# Patient Record
Sex: Male | Born: 1975 | Race: White | Hispanic: No | Marital: Married | Smoking: Current every day smoker
Health system: Southern US, Community
[De-identification: ages and names within clinical notes are randomized; demographics above are authoritative.]

## PROBLEM LIST (undated history)

## (undated) DIAGNOSIS — K219 Gastro-esophageal reflux disease without esophagitis: Secondary | ICD-10-CM

## (undated) HISTORY — PX: ESOPHAGOGASTRODUODENOSCOPY: SHX1529

---

## 2015-02-06 ENCOUNTER — Other Ambulatory Visit (HOSPITAL_COMMUNITY): Payer: Self-pay | Admitting: Orthopaedic Surgery

## 2015-02-06 ENCOUNTER — Observation Stay (HOSPITAL_COMMUNITY)
Admission: AD | Admit: 2015-02-06 | Discharge: 2015-02-07 | Disposition: A | Discharge status: Home / Self Care | Payer: BLUE CROSS/BLUE SHIELD | Source: Ambulatory Visit | Attending: Orthopaedic Surgery | Admitting: Orthopaedic Surgery

## 2015-02-06 ENCOUNTER — Encounter (HOSPITAL_COMMUNITY): Payer: Self-pay | Admitting: *Deleted

## 2015-02-06 ENCOUNTER — Ambulatory Visit (HOSPITAL_COMMUNITY): Payer: BLUE CROSS/BLUE SHIELD | Admitting: Certified Registered"

## 2015-02-06 ENCOUNTER — Ambulatory Visit (HOSPITAL_COMMUNITY): Payer: BLUE CROSS/BLUE SHIELD

## 2015-02-06 ENCOUNTER — Encounter (HOSPITAL_COMMUNITY)
Admission: AD | Disposition: A | Discharge status: Home / Self Care | Payer: Self-pay | Source: Ambulatory Visit | Attending: Orthopaedic Surgery

## 2015-02-06 DIAGNOSIS — S42002A Fracture of unspecified part of left clavicle, initial encounter for closed fracture: Secondary | ICD-10-CM | POA: Diagnosis present

## 2015-02-06 DIAGNOSIS — Y9289 Other specified places as the place of occurrence of the external cause: Secondary | ICD-10-CM | POA: Insufficient documentation

## 2015-02-06 DIAGNOSIS — Z419 Encounter for procedure for purposes other than remedying health state, unspecified: Secondary | ICD-10-CM

## 2015-02-06 DIAGNOSIS — K219 Gastro-esophageal reflux disease without esophagitis: Secondary | ICD-10-CM | POA: Insufficient documentation

## 2015-02-06 DIAGNOSIS — Z886 Allergy status to analgesic agent status: Secondary | ICD-10-CM | POA: Diagnosis not present

## 2015-02-06 DIAGNOSIS — F17210 Nicotine dependence, cigarettes, uncomplicated: Secondary | ICD-10-CM | POA: Diagnosis not present

## 2015-02-06 DIAGNOSIS — S42022A Displaced fracture of shaft of left clavicle, initial encounter for closed fracture: Secondary | ICD-10-CM | POA: Diagnosis not present

## 2015-02-06 DIAGNOSIS — S42009A Fracture of unspecified part of unspecified clavicle, initial encounter for closed fracture: Secondary | ICD-10-CM | POA: Diagnosis present

## 2015-02-06 HISTORY — PX: ORIF CLAVICULAR FRACTURE: SHX5055

## 2015-02-06 HISTORY — DX: Gastro-esophageal reflux disease without esophagitis: K21.9

## 2015-02-06 LAB — COMPREHENSIVE METABOLIC PANEL
ALT: 14 U/L — AB (ref 17–63)
ANION GAP: 9 (ref 5–15)
AST: 22 U/L (ref 15–41)
Albumin: 4.1 g/dL (ref 3.5–5.0)
Alkaline Phosphatase: 55 U/L (ref 38–126)
BUN: 7 mg/dL (ref 6–20)
CO2: 25 mmol/L (ref 22–32)
CREATININE: 1.02 mg/dL (ref 0.61–1.24)
Calcium: 9.4 mg/dL (ref 8.9–10.3)
Chloride: 106 mmol/L (ref 101–111)
GFR calc Af Amer: >60 mL/min (ref >60)
Glucose, Bld: 82 mg/dL (ref 65–99)
Potassium: 4.6 mmol/L (ref 3.5–5.1)
Sodium: 140 mmol/L (ref 135–145)
Total Bilirubin: 0.8 mg/dL (ref 0.3–1.2)
Total Protein: 6.8 g/dL (ref 6.5–8.1)

## 2015-02-06 LAB — CBC
HEMATOCRIT: 43.4 % (ref 39.0–52.0)
HEMOGLOBIN: 15.6 g/dL (ref 13.0–17.0)
MCH: 32.2 pg (ref 26.0–34.0)
MCHC: 35.9 g/dL (ref 30.0–36.0)
MCV: 89.5 fL (ref 78.0–100.0)
PLATELETS: 195 10*3/uL (ref 150–400)
RBC: 4.85 MIL/uL (ref 4.22–5.81)
RDW: 12.1 % (ref 11.5–15.5)
WBC: 5.5 10*3/uL (ref 4.0–10.5)

## 2015-02-06 SURGERY — OPEN REDUCTION INTERNAL FIXATION (ORIF) CLAVICULAR FRACTURE
Anesthesia: General | Site: Chest | Laterality: Left

## 2015-02-06 MED ORDER — PROMETHAZINE HCL 25 MG/ML IJ SOLN
6.2500 mg | INTRAMUSCULAR | Status: DC | PRN
  Administered 2015-02-06: 6.25 mg via INTRAVENOUS

## 2015-02-06 MED ORDER — GLYCOPYRROLATE 0.2 MG/ML IJ SOLN
INTRAMUSCULAR | Status: AC
  Filled 2015-02-06: qty 1

## 2015-02-06 MED ORDER — DIAZEPAM 5 MG PO TABS
ORAL_TABLET | ORAL | Status: AC
  Filled 2015-02-06: qty 1

## 2015-02-06 MED ORDER — GLYCOPYRROLATE 0.2 MG/ML IJ SOLN
INTRAMUSCULAR | Status: DC | PRN
Start: 2015-02-06 — End: 2015-02-06
  Administered 2015-02-06: 0.2 mg via INTRAVENOUS
  Administered 2015-02-06: 0.6 mg via INTRAVENOUS

## 2015-02-06 MED ORDER — PROPOFOL 10 MG/ML IV BOLUS
INTRAVENOUS | Status: AC
  Filled 2015-02-06: qty 20

## 2015-02-06 MED ORDER — PROMETHAZINE HCL 25 MG/ML IJ SOLN
INTRAMUSCULAR | Status: AC
Start: 2015-02-06 — End: 2015-02-07
  Filled 2015-02-06: qty 1

## 2015-02-06 MED ORDER — KETOROLAC TROMETHAMINE 30 MG/ML IJ SOLN
INTRAMUSCULAR | Status: DC | PRN
  Administered 2015-02-06: 30 mg via INTRAVENOUS

## 2015-02-06 MED ORDER — MIDAZOLAM HCL 2 MG/2ML IJ SOLN: 0.5000 mg | Freq: Once | INTRAMUSCULAR | Status: DC | PRN

## 2015-02-06 MED ORDER — ONDANSETRON HCL 4 MG PO TABS
4.0000 mg | ORAL_TABLET | Freq: Four times a day (QID) | ORAL | Status: DC | PRN
  Administered 2015-02-07: 4 mg via ORAL
  Filled 2015-02-06: qty 1

## 2015-02-06 MED ORDER — BUPIVACAINE-EPINEPHRINE (PF) 0.25% -1:200000 IJ SOLN
INTRAMUSCULAR | Status: AC
  Filled 2015-02-06: qty 30

## 2015-02-06 MED ORDER — OXYCODONE-ACETAMINOPHEN 5-325 MG PO TABS
2.0000 | ORAL_TABLET | ORAL | Status: DC | PRN
  Administered 2015-02-07 (×3): 2 via ORAL
  Filled 2015-02-06 (×3): qty 2

## 2015-02-06 MED ORDER — KETOROLAC TROMETHAMINE 30 MG/ML IJ SOLN
INTRAMUSCULAR | Status: AC
  Filled 2015-02-06: qty 1

## 2015-02-06 MED ORDER — METOCLOPRAMIDE HCL 5 MG/ML IJ SOLN: 5.0000 mg | Freq: Three times a day (TID) | INTRAMUSCULAR | Status: DC | PRN

## 2015-02-06 MED ORDER — CEFAZOLIN SODIUM-DEXTROSE 2-3 GM-% IV SOLR
2.0000 g | INTRAVENOUS | Status: AC
  Administered 2015-02-06: 2 g via INTRAVENOUS
  Filled 2015-02-06: qty 50

## 2015-02-06 MED ORDER — LACTATED RINGERS IV SOLN
INTRAVENOUS | Status: DC
  Administered 2015-02-06 (×2): via INTRAVENOUS

## 2015-02-06 MED ORDER — PROPOFOL 10 MG/ML IV BOLUS
INTRAVENOUS | Status: DC | PRN
  Administered 2015-02-06: 200 mg via INTRAVENOUS

## 2015-02-06 MED ORDER — LIDOCAINE HCL (CARDIAC) 20 MG/ML IV SOLN
INTRAVENOUS | Status: DC | PRN
  Administered 2015-02-06: 30 mg via INTRAVENOUS

## 2015-02-06 MED ORDER — TRAMADOL HCL 50 MG PO TABS
50.0000 mg | ORAL_TABLET | Freq: Four times a day (QID) | ORAL | Status: DC | PRN
  Administered 2015-02-06: 50 mg via ORAL
  Filled 2015-02-06: qty 1

## 2015-02-06 MED ORDER — OXYCODONE-ACETAMINOPHEN 5-325 MG PO TABS
1.0000 | ORAL_TABLET | Freq: Once | ORAL | Status: AC
  Administered 2015-02-06: 1 via ORAL

## 2015-02-06 MED ORDER — FENTANYL CITRATE (PF) 250 MCG/5ML IJ SOLN
INTRAMUSCULAR | Status: AC
  Filled 2015-02-06: qty 5

## 2015-02-06 MED ORDER — HYDROMORPHONE HCL 1 MG/ML IJ SOLN
0.2500 mg | INTRAMUSCULAR | Status: DC | PRN
  Administered 2015-02-06 (×3): 0.5 mg via INTRAVENOUS

## 2015-02-06 MED ORDER — BUPIVACAINE-EPINEPHRINE (PF) 0.5% -1:200000 IJ SOLN
INTRAMUSCULAR | Status: DC | PRN
  Administered 2015-02-06: 20 mL

## 2015-02-06 MED ORDER — DIAZEPAM 5 MG PO TABS
5.0000 mg | ORAL_TABLET | Freq: Two times a day (BID) | ORAL | Status: DC | PRN
  Administered 2015-02-06 – 2015-02-07 (×2): 5 mg via ORAL
  Filled 2015-02-06: qty 1

## 2015-02-06 MED ORDER — ONDANSETRON HCL 4 MG/2ML IJ SOLN
INTRAMUSCULAR | Status: DC | PRN
  Administered 2015-02-06: 4 mg via INTRAVENOUS

## 2015-02-06 MED ORDER — HYDROMORPHONE HCL 1 MG/ML IJ SOLN: 0.5000 mg | INTRAMUSCULAR | Status: DC | PRN

## 2015-02-06 MED ORDER — ROCURONIUM BROMIDE 50 MG/5ML IV SOLN
INTRAVENOUS | Status: AC
  Filled 2015-02-06: qty 1

## 2015-02-06 MED ORDER — MIDAZOLAM HCL 5 MG/5ML IJ SOLN
INTRAMUSCULAR | Status: DC | PRN
  Administered 2015-02-06: 2 mg via INTRAVENOUS

## 2015-02-06 MED ORDER — METOCLOPRAMIDE HCL 5 MG PO TABS
5.0000 mg | ORAL_TABLET | Freq: Three times a day (TID) | ORAL | Status: DC | PRN
Start: 2015-02-06 — End: 2015-02-07

## 2015-02-06 MED ORDER — ONDANSETRON HCL 4 MG/2ML IJ SOLN: 4.0000 mg | Freq: Four times a day (QID) | INTRAMUSCULAR | Status: DC | PRN

## 2015-02-06 MED ORDER — OXYCODONE-ACETAMINOPHEN 5-325 MG PO TABS
ORAL_TABLET | ORAL | Status: AC
  Filled 2015-02-06: qty 1

## 2015-02-06 MED ORDER — BUPIVACAINE-EPINEPHRINE (PF) 0.5% -1:200000 IJ SOLN
INTRAMUSCULAR | Status: AC
  Filled 2015-02-06: qty 30

## 2015-02-06 MED ORDER — POTASSIUM CHLORIDE IN NACL 20-0.45 MEQ/L-% IV SOLN
INTRAVENOUS | Status: DC
  Filled 2015-02-06 (×3): qty 1000

## 2015-02-06 MED ORDER — FENTANYL CITRATE (PF) 100 MCG/2ML IJ SOLN
INTRAMUSCULAR | Status: DC | PRN
  Administered 2015-02-06 (×3): 50 ug via INTRAVENOUS
  Administered 2015-02-06: 100 ug via INTRAVENOUS

## 2015-02-06 MED ORDER — MEPERIDINE HCL 25 MG/ML IJ SOLN: 6.2500 mg | INTRAMUSCULAR | Status: DC | PRN

## 2015-02-06 MED ORDER — NEOSTIGMINE METHYLSULFATE 10 MG/10ML IV SOLN
INTRAVENOUS | Status: AC
  Filled 2015-02-06: qty 1

## 2015-02-06 MED ORDER — MIDAZOLAM HCL 2 MG/2ML IJ SOLN
INTRAMUSCULAR | Status: AC
  Filled 2015-02-06: qty 2

## 2015-02-06 MED ORDER — NEOSTIGMINE METHYLSULFATE 10 MG/10ML IV SOLN
INTRAVENOUS | Status: DC | PRN
  Administered 2015-02-06: 4 mg via INTRAVENOUS

## 2015-02-06 MED ORDER — HYDROMORPHONE HCL 1 MG/ML IJ SOLN
INTRAMUSCULAR | Status: AC
  Filled 2015-02-06: qty 2

## 2015-02-06 MED ORDER — ROCURONIUM BROMIDE 100 MG/10ML IV SOLN
INTRAVENOUS | Status: DC | PRN
  Administered 2015-02-06: 10 mg via INTRAVENOUS
  Administered 2015-02-06: 40 mg via INTRAVENOUS

## 2015-02-06 SURGICAL SUPPLY — 54 items
BIT DRILL 110X2.5XQCK CNCT (BIT) ×1 IMPLANT
BIT DRILL 2.5 (BIT) ×2
BIT DRL 110X2.5XQCK CNCT (BIT) ×1
CLOSURE STERI-STRIP 1/2X4 (GAUZE/BANDAGES/DRESSINGS) ×1
CLOSURE WOUND 1/2 X4 (GAUZE/BANDAGES/DRESSINGS) ×1
CLSR STERI-STRIP ANTIMIC 1/2X4 (GAUZE/BANDAGES/DRESSINGS) ×2 IMPLANT
COVER SURGICAL LIGHT HANDLE (MISCELLANEOUS) ×3 IMPLANT
DRAPE C-ARM 42X72 X-RAY (DRAPES) ×3 IMPLANT
DRAPE IMP U-DRAPE 54X76 (DRAPES) ×3 IMPLANT
DRAPE INCISE IOBAN 66X45 STRL (DRAPES) IMPLANT
DRAPE U-SHAPE 47X51 STRL (DRAPES) ×3 IMPLANT
DRSG EMULSION OIL 3X3 NADH (GAUZE/BANDAGES/DRESSINGS) ×3 IMPLANT
ELECT REM PT RETURN 9FT ADLT (ELECTROSURGICAL) ×3
ELECTRODE REM PT RTRN 9FT ADLT (ELECTROSURGICAL) ×1 IMPLANT
GAUZE SPONGE 4X4 12PLY STRL (GAUZE/BANDAGES/DRESSINGS) ×3 IMPLANT
GLOVE BIOGEL PI IND STRL 8 (GLOVE) ×2 IMPLANT
GLOVE BIOGEL PI INDICATOR 8 (GLOVE) ×4
GLOVE ORTHO TXT STRL SZ7.5 (GLOVE) ×6 IMPLANT
GOWN STRL REUS W/ TWL LRG LVL3 (GOWN DISPOSABLE) ×1 IMPLANT
GOWN STRL REUS W/ TWL XL LVL3 (GOWN DISPOSABLE) ×1 IMPLANT
GOWN STRL REUS W/TWL 2XL LVL3 (GOWN DISPOSABLE) ×3 IMPLANT
GOWN STRL REUS W/TWL LRG LVL3 (GOWN DISPOSABLE) ×2
GOWN STRL REUS W/TWL XL LVL3 (GOWN DISPOSABLE) ×2
KIT BASIN OR (CUSTOM PROCEDURE TRAY) ×3 IMPLANT
KIT ROOM TURNOVER OR (KITS) ×3 IMPLANT
MANIFOLD NEPTUNE II (INSTRUMENTS) ×3 IMPLANT
NEEDLE HYPO 25GX1X1/2 BEV (NEEDLE) IMPLANT
NS IRRIG 1000ML POUR BTL (IV SOLUTION) ×3 IMPLANT
PACK SHOULDER (CUSTOM PROCEDURE TRAY) ×3 IMPLANT
PACK UNIVERSAL I (CUSTOM PROCEDURE TRAY) ×3 IMPLANT
PAD ARMBOARD 7.5X6 YLW CONV (MISCELLANEOUS) ×6 IMPLANT
PLATE RECON 10H (Plate) ×3 IMPLANT
SCREW CORT 2.5X20X3.5XST SM (Screw) ×3 IMPLANT
SCREW CORT 2.5X24X3.5XST SM (Screw) ×1 IMPLANT
SCREW CORTICAL 3.5 16MM (Screw) ×6 IMPLANT
SCREW CORTICAL 3.5 18MM (Screw) ×6 IMPLANT
SCREW CORTICAL 3.5X12 (Screw) ×3 IMPLANT
SCREW CORTICAL 3.5X14 (Screw) ×3 IMPLANT
SCREW CORTICAL 3.5X20 (Screw) ×6 IMPLANT
SCREW CORTICAL 3.5X24MM (Screw) ×2 IMPLANT
SCREW CORTICAL 3.5X26 (Screw) ×3 IMPLANT
SPONGE GAUZE 4X4 12PLY STER LF (GAUZE/BANDAGES/DRESSINGS) ×3 IMPLANT
SPONGE LAP 18X18 X RAY DECT (DISPOSABLE) ×6 IMPLANT
STRIP CLOSURE SKIN 1/2X4 (GAUZE/BANDAGES/DRESSINGS) ×2 IMPLANT
SUCTION FRAZIER TIP 10 FR DISP (SUCTIONS) ×3 IMPLANT
SUT PROLENE 3 0 PS 1 (SUTURE) ×3 IMPLANT
SUT VIC AB 2-0 CT1 27 (SUTURE) ×4
SUT VIC AB 2-0 CT1 TAPERPNT 27 (SUTURE) ×2 IMPLANT
SUT VIC AB 3-0 FS2 27 (SUTURE) ×6 IMPLANT
SUT VICRYL 0 CT 1 36IN (SUTURE) ×3 IMPLANT
SYR CONTROL 10ML LL (SYRINGE) IMPLANT
TAPE CLOTH 3X10 TAN LF (GAUZE/BANDAGES/DRESSINGS) ×3 IMPLANT
WATER STERILE IRR 1000ML POUR (IV SOLUTION) ×3 IMPLANT
YANKAUER SUCT BULB TIP NO VENT (SUCTIONS) ×3 IMPLANT

## 2015-02-06 NOTE — Brief Op Note (Signed)
02/06/2015  5:19 PM  PATIENT:  Adam Hart  39 y.o. male  PRE-OPERATIVE DIAGNOSIS:  Comminuted Left Clavicle Fracture  POST-OPERATIVE DIAGNOSIS:  Comminuted Left Clavicle Fracture  PROCEDURE:  Procedure(s): OPEN REDUCTION INTERNAL FIXATION (ORIF) CLAVICULAR FRACTURE (Left)  SURGEON:  Surgeon(s) and Role:    * Eldred MangesMark C Yates, MD - Primary  PHYSICIAN ASSISTANT:    ANESTHESIA:   general  EBL:     BLOOD ADMINISTERED:none  DRAINS: none    SPECIMEN:  No Specimen  DISPOSITION OF SPECIMEN:  N/A  COUNTS:  YES  TOURNIQUET:  * No tourniquets in log *    PATIENT DISPOSITION:  PACU - hemodynamically stable.

## 2015-02-06 NOTE — OR Nursing (Signed)
Dr. Sampson GoonFitzgerald informed of SB, stable BP, all other pertinent assessments, interventions, responses. Okay to proceed to tx to 5N.

## 2015-02-06 NOTE — Interval H&P Note (Signed)
History and Physical Interval Note:  02/06/2015 4:31 PM  Adam Hart  has presented today for surgery, with the diagnosis of Comminuted Left Clavicle Fracture  The various methods of treatment have been discussed with the patient and family. After consideration of risks, benefits and other options for treatment, the patient has consented to  Procedure(s): OPEN REDUCTION INTERNAL FIXATION (ORIF) CLAVICULAR FRACTURE (Left) as a surgical intervention .  The patient's history has been reviewed, patient examined, no change in status, stable for surgery.  I have reviewed the patient's chart and labs.  Questions were answered to the patient's satisfaction.     Ashyr Hedgepath C

## 2015-02-06 NOTE — Anesthesia Procedure Notes (Signed)
Procedure Name: Intubation Date/Time: 02/06/2015 4:51 PM Performed by: De NurseENNIE, Yulieth Carrender E Pre-anesthesia Checklist: Patient identified, Emergency Drugs available, Suction available, Patient being monitored and Timeout performed Patient Re-evaluated:Patient Re-evaluated prior to inductionOxygen Delivery Method: Circle system utilized Preoxygenation: Pre-oxygenation with 100% oxygen Intubation Type: IV induction Ventilation: Mask ventilation without difficulty Laryngoscope Size: Mac and 3 Grade View: Grade I Tube type: Oral Tube size: 7.5 mm Number of attempts: 1 Airway Equipment and Method: Stylet Placement Confirmation: ETT inserted through vocal cords under direct vision,  positive ETCO2 and breath sounds checked- equal and bilateral Secured at: 22 cm Tube secured with: Tape Dental Injury: Teeth and Oropharynx as per pre-operative assessment

## 2015-02-06 NOTE — Transfer of Care (Signed)
Immediate Anesthesia Transfer of Care Note  Patient: Adam Hart  Procedure(s) Performed: Procedure(s): OPEN REDUCTION INTERNAL FIXATION (ORIF) CLAVICULAR FRACTURE (Left)  Patient Location: PACU  Anesthesia Type:General  Level of Consciousness: lethargic and responds to stimulation  Airway & Oxygen Therapy: Patient Spontanous Breathing and Patient connected to nasal cannula oxygen  Post-op Assessment: Report given to RN  Post vital signs: Reviewed and stable  Last Vitals:  Filed Vitals:   02/06/15 1416  BP: 110/70  Pulse: 51  Temp: 36.5 C  Resp: 18    Complications: No apparent anesthesia complications
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
## 2015-02-06 NOTE — Anesthesia Preprocedure Evaluation (Addendum)
Anesthesia Evaluation  Patient identified by MRN, date of birth, ID band Patient awake    Reviewed: Allergy & Precautions, NPO status , Patient's Chart, lab work & pertinent test results  History of Anesthesia Complications Negative for: history of anesthetic complications  Airway Mallampati: I  TM Distance: >3 FB Neck ROM: Full    Dental  (+) Teeth Intact, Dental Advisory Given   Pulmonary former smoker (quit 15 years ago),  breath sounds clear to auscultation        Cardiovascular negative cardio ROS  Rhythm:Regular Rate:Normal     Neuro/Psych negative neurological ROS     GI/Hepatic Neg liver ROS, GERD-  Medicated and Controlled,  Endo/Other  negative endocrine ROS  Renal/GU negative Renal ROS     Musculoskeletal   Abdominal (+) - obese,   Peds  Hematology negative hematology ROS (+)   Anesthesia Other Findings   Reproductive/Obstetrics                            Anesthesia Physical Anesthesia Plan  ASA: II  Anesthesia Plan: General   Post-op Pain Management:    Induction: Intravenous  Airway Management Planned: Oral ETT  Additional Equipment:   Intra-op Plan:   Post-operative Plan: Extubation in OR  Informed Consent: I have reviewed the patients History and Physical, chart, labs and discussed the procedure including the risks, benefits and alternatives for the proposed anesthesia with the patient or authorized representative who has indicated his/her understanding and acceptance.   Dental advisory given  Plan Discussed with: CRNA and Surgeon  Anesthesia Plan Comments: (Plan routine monitors, GETA Dr. Ophelia CharterYates related clavicle is mid-shaft fracture, he prefers local infiltration over ISB for analgesia)        Anesthesia Quick Evaluation

## 2015-02-06 NOTE — OR Nursing (Signed)
Dr Ophelia CharterYates updated to pt status and need to proceed with admission for the night for patient pain management.

## 2015-02-06 NOTE — Anesthesia Postprocedure Evaluation (Signed)
  Anesthesia Post-op Note  Patient: Adam Hart  Procedure(s) Performed: Procedure(s): OPEN REDUCTION INTERNAL FIXATION (ORIF) CLAVICULAR FRACTURE (Left)  Patient Location: PACU  Anesthesia Type:General  Level of Consciousness: awake and alert   Airway and Oxygen Therapy: Patient Spontanous Breathing  Post-op Pain: none  Post-op Assessment: Post-op Vital signs reviewed, Patient's Cardiovascular Status Stable and Respiratory Function Stable  Post-op Vital Signs: Reviewed  Filed Vitals:   02/06/15 1945  BP: 110/64  Pulse: 41  Temp:   Resp: 13    Complications: No apparent anesthesia complications

## 2015-02-07 ENCOUNTER — Encounter (HOSPITAL_COMMUNITY): Payer: Self-pay | Admitting: Orthopaedic Surgery

## 2015-02-07 DIAGNOSIS — S42022A Displaced fracture of shaft of left clavicle, initial encounter for closed fracture: Secondary | ICD-10-CM | POA: Diagnosis not present

## 2015-02-07 NOTE — Progress Notes (Signed)
Subjective: Doing well.  Pain controlled.  No adverse reaction to meds.    Objective: Vital signs in last 24 hours: Temp:  [97.7 F (36.5 C)-98.2 F (36.8 C)] 98 F (36.7 C) (06/02 0500) Pulse Rate:  [39-73] 42 (06/02 0500) Resp:  [5-18] 15 (06/02 0500) BP: (102-126)/(61-84) 117/75 mmHg (06/02 0500) SpO2:  [96 %-100 %] 100 % (06/02 0500) Weight:  [79.379 kg (175 lb)] 79.379 kg (175 lb) (06/01 1416)  Intake/Output from previous day: 06/01 0701 - 06/02 0700 In: 1500 [P.O.:100; I.V.:1400] Out: -  Intake/Output this shift:     Recent Labs  02/06/15 1430  HGB 15.6    Recent Labs  02/06/15 1430  WBC 5.5  RBC 4.85  HCT 43.4  PLT 195    Recent Labs  02/06/15 1430  NA 140  K 4.6  CL 106  CO2 25  BUN 7  CREATININE 1.02  GLUCOSE 82  CALCIUM 9.4   No results for input(s): LABPT, INR in the last 72 hours.  Exam:  Slight bleeding through dressing.  NVI.    Assessment/Plan: Anticipate d/c home today.  D/c IV.  Dressing change.    Alanis Clift M 02/07/2015, 8:34 AM

## 2015-02-07 NOTE — Op Note (Signed)
NAMLondon Pepper:  Zwiefelhofer, Dayan                   ACCOUNT NO.:  [id number]  MEDICAL RECORD NO.:  [id number]  LOCATION:  [id number]                        FACILITY:  [facility]  PHYSICIAN:  Kristle Wesch C. Ophelia CharterYates, M.D.    DATE OF BIRTH:  [birth year]  DATE OF PROCEDURE:  02/06/2015 DATE OF DISCHARGE:                              OPERATIVE REPORT   PREOPERATIVE DIAGNOSIS:  Left comminuted, displaced clavicle fracture.  POSTOPERATIVE DIAGNOSIS:  Left comminuted, displaced clavicle fracture.  PROCEDURE:  Open reduction and internal fixation of left clavicle.  SURGEON:  Morrell Fluke C. Ophelia CharterYates, M.D.  ANESTHESIA:  General.  ESTIMATED BLOOD LOSS:  Minimal.  DRAINS:  None.  IMPLANTS:  Biomet stainless steel Recon plate, [id number]-hole.  EBL:  Minimal.  DESCRIPTION OF PROCEDURE:  After induction of general anesthesia, beach- chair position, prepping with DuraPrep, Ancef prophylaxis, split sheets, drapes, sterile skin marker, Betadine, Steri-Drape, application of sling, arms were secured with the right arm on a Mayo stand with pillows and left arm wrapped with yellow egg crepes with his arm crossed in his lap and taped securely.  Time-out procedure was completed.  Incision was made along the anterior aspect of the clavicle.  Medial and lateral aspects were exposed.  Fracture was comminuted with two butterfly fragments present.  Fracture was reduced.  The Recon plate was custom bent, fashioned along the anterior aspect of the clavicle and then fixed medial and lateral, and then progressing toward the mid portion.  Lag screw was placed from superolateral to inferomedial.  Care was taken to protect the underlying neurovascular bundle.  This compressed two of the fragments.  One screw hole was left open, which was at the level of the fracture.  Main screws were tightened and some of the screws had to be exchanged for slightly shorter, ones as it sucked the plate directly down against the bone.  X-ray was taken, showing good position  and alignment.  Fracture was stable secured.  All screws were hand tightened.  Irrigation with saline solution, closure with 2-0 Vicryl in the muscle, closing periosteum and muscle overlying the clavicle followed by some interrupted 2-0 on the subcu, 3-0 Vicryl subcuticular skin closure. Tincture of benzoin, Steri-Strips, Marcaine infiltration, 4x4s, tape, and sling application.  Instrument count and needle count were correct.     Sheridan Gettel C. Ophelia CharterYates, M.D.     MCY/MEDQ  D:  02/06/2015  T:  02/07/2015  Job:  [id number]

## 2015-02-27 NOTE — Discharge Summary (Signed)
Patient ID: Adam Hart MRN: [id number] DOB/AGE: 1976-06-21 38 y.o.  Admit date: 02/06/2015 Discharge date: 02/27/2015  Admission Diagnoses:  Active Problems:   Clavicle fracture   Discharge Diagnoses:  Active Problems:   Clavicle fracture  status post Procedure(s): OPEN REDUCTION INTERNAL FIXATION (ORIF) CLAVICULAR FRACTURE  Past Medical History  Diagnosis Date  . GERD (gastroesophageal reflux disease)     Surgeries: Procedure(s): OPEN REDUCTION INTERNAL FIXATION (ORIF) CLAVICULAR FRACTURE on 02/06/2015   Consultants:    Discharged Condition: Improved  Hospital Course: Adam Hart is an 39 y.o. male who was admitted 02/06/2015 for operative treatment of clavicle fracture. Patient failed conservative treatments (please see the history and physical for the specifics) and had severe unremitting pain that affects sleep, daily activities and work/hobbies. After pre-op clearance, the patient was taken to the operating room on 02/06/2015 and underwent  Procedure(s): OPEN REDUCTION INTERNAL FIXATION (ORIF) CLAVICULAR FRACTURE.    Patient was given perioperative antibiotics:  Anti-infectives    Start     Dose/Rate Route Frequency Ordered Stop   02/07/15 0600  ceFAZolin (ANCEF) IVPB 2 g/50 mL premix     2 g 100 mL/hr over 30 Minutes Intravenous On call to O.R. 02/06/15 1423 02/06/15 1653       Patient was given sequential compression devices and early ambulation to prevent DVT.   Patient benefited maximally from hospital stay and there were no complications. At the time of discharge, the patient was urinating/moving their bowels without difficulty, tolerating a regular diet, pain is controlled with oral pain medications and they have been cleared by PT/OT.   Recent vital signs: No data found.    Recent laboratory studies: No results for input(s): WBC, HGB, HCT, PLT, NA, K, CL, CO2, BUN, CREATININE, GLUCOSE, INR, CALCIUM in the last 72 hours.  Invalid input(s): PT, 2   Discharge  Medications:     Medication List    STOP taking these medications        ibuprofen 200 MG tablet  Commonly known as:  ADVIL,MOTRIN      TAKE these medications        diazepam 5 MG tablet  Commonly known as:  VALIUM  Take 5 mg by mouth 2 (two) times daily as needed for muscle spasms.     diclofenac 75 MG EC tablet  Commonly known as:  VOLTAREN  Take 75 mg by mouth 2 (two) times daily as needed. pain     omeprazole 20 MG capsule  Commonly known as:  PRILOSEC  Take 20 mg by mouth daily.     oxyCODONE-acetaminophen 5-325 MG per tablet  Commonly known as:  PERCOCET/ROXICET  Take 1-2 tablets by mouth every 6 (six) hours as needed. pain        Diagnostic Studies: Dg Clavicle Left  02/06/2015   CLINICAL DATA:  ORIF of the LEFT clavicle.  EXAM: DG C-ARM 61-120 MIN; LEFT CLAVICLE - 2+ VIEWS  COMPARISON:  None.  FINDINGS: Three intraoperative fluoroscopic spot films demonstrate malleable metal plate and screw fixation of the LEFT clavicle with intra fragmentary screw. Mild residual step-off deformity is present on the last image.  IMPRESSION: ORIF of the LEFT clavicle.   Electronically Signed   By: Andreas Newport M.D.   On: 02/06/2015 18:41   Dg C-arm 1-60 Min  02/06/2015   CLINICAL DATA:  ORIF of the LEFT clavicle.  EXAM: DG C-ARM 61-120 MIN; LEFT CLAVICLE - 2+ VIEWS  COMPARISON:  None.  FINDINGS: Three intraoperative fluoroscopic spot films demonstrate  malleable metal plate and screw fixation of the LEFT clavicle with intra fragmentary screw. Mild residual step-off deformity is present on the last image.  IMPRESSION: ORIF of the LEFT clavicle.   Electronically Signed   By: Andreas Newport M.D.   On: 02/06/2015 18:41        Discharge Instructions    Call MD / Call 911    Complete by:  As directed   If you experience chest pain or shortness of breath, CALL 911 and be transported to the hospital emergency room.  If you develope a fever above 101 F, pus (white drainage) or increased  drainage or redness at the wound, or calf pain, call your surgeon's office.     Constipation Prevention    Complete by:  As directed   Drink plenty of fluids.  Prune juice may be helpful.  You may use a stool softener, such as Colace (over the counter) 100 mg twice a day.  Use MiraLax (over the counter) for constipation as needed.     Diet - low sodium heart healthy    Complete by:  As directed      Discharge instructions    Complete by:  As directed   Do not remove dressing or get wound wet.  Shoulder immobilizer on at all times.  Do not move shoulder.  No lifting.  Do not apply any creams or ointments to incision.     Driving restrictions    Complete by:  As directed   No driving     Increase activity slowly as tolerated    Complete by:  As directed      Lifting restrictions    Complete by:  As directed   No lifting           Follow-up Information    Schedule an appointment as soon as possible for a visit with Eldred Manges, MD.   Specialty:  Orthopedic Surgery   Why:  needs return office visit in one week.    Contact information:   [address] [address] [place] [address] [phone]       Discharge Plan:  discharge to home  Disposition:     Signed: Naida Sleight  02/27/2015, 7:09 AM

## 2016-01-27 IMAGING — RF DG C-ARM 61-120 MIN
1 series · 3 of 3 positions shown · non-contrast
Comparison: None.

CLINICAL DATA: ORIF of the LEFT clavicle.

EXAM:
DG C-ARM 61-120 MIN; LEFT CLAVICLE - 2+ VIEWS

[Series 1: run · 3 of 3 slices shown]
[im 1/3]
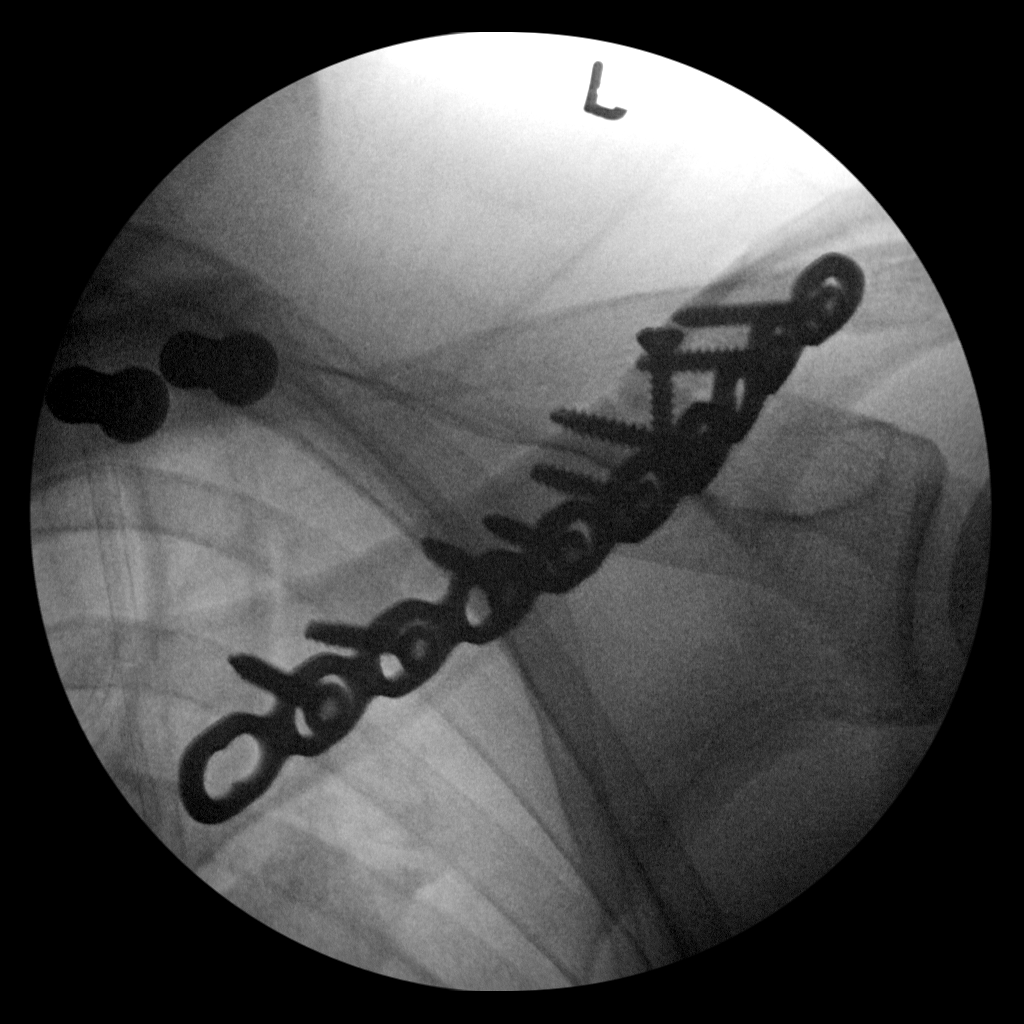
[im 2/3]
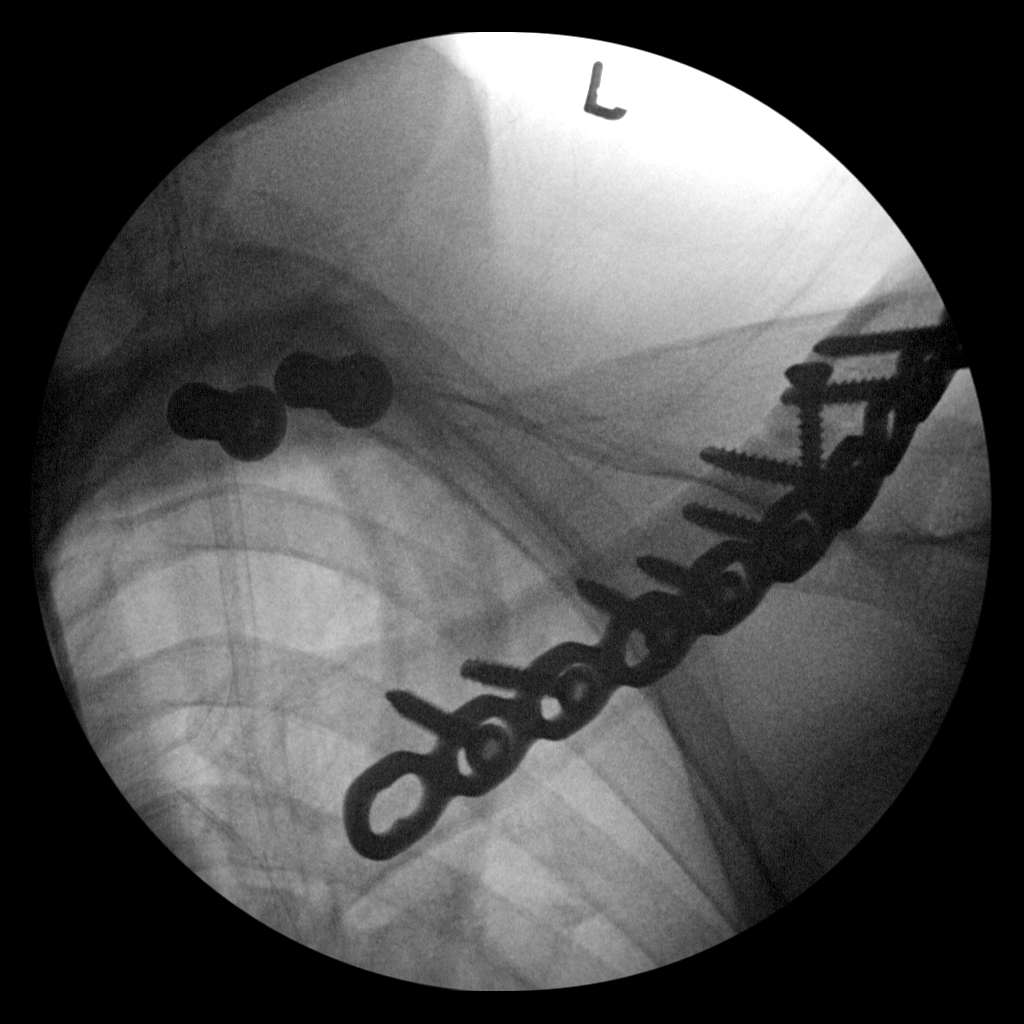
[im 3/3]
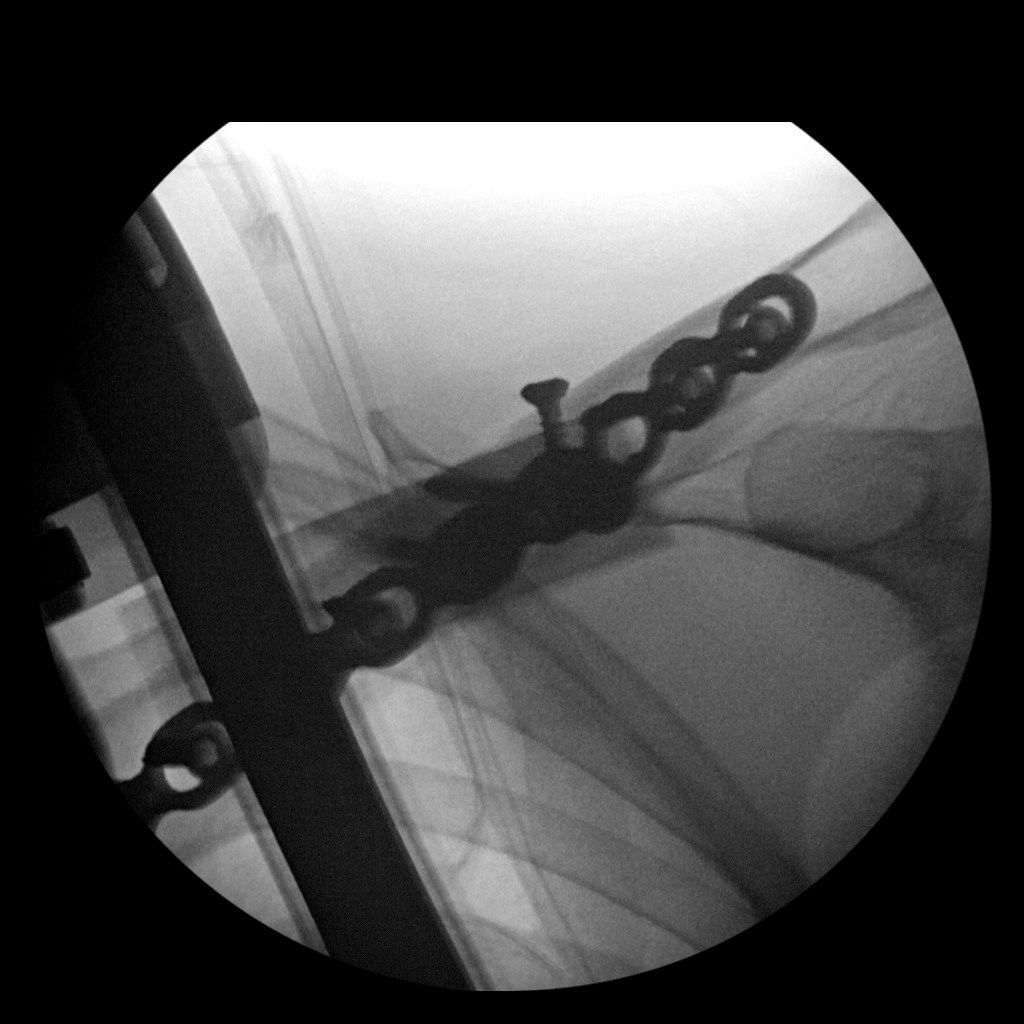

[3 of 3 positions shown; findings below may reference images not displayed]

FINDINGS: Three intraoperative fluoroscopic spot films demonstrate malleable
metal plate and screw fixation of the LEFT clavicle with intra
fragmentary screw. Mild residual step-off deformity is present on
the last image.
IMPRESSION: ORIF of the LEFT clavicle.
